# Patient Record
Sex: Male | Born: 1955 | Marital: Single | Smoking: Never smoker
Health system: Southern US, Community
[De-identification: ages and names within clinical notes are randomized; demographics above are authoritative.]

---

## 2015-12-17 ENCOUNTER — Encounter: Payer: Self-pay | Admitting: Podiatry

## 2015-12-17 ENCOUNTER — Ambulatory Visit (INDEPENDENT_AMBULATORY_CARE_PROVIDER_SITE_OTHER): Payer: BLUE CROSS/BLUE SHIELD | Admitting: Podiatry

## 2015-12-17 ENCOUNTER — Ambulatory Visit (HOSPITAL_BASED_OUTPATIENT_CLINIC_OR_DEPARTMENT_OTHER)
Admission: RE | Admit: 2015-12-17 | Discharge: 2015-12-17 | Disposition: A | Discharge status: Home / Self Care | Payer: BLUE CROSS/BLUE SHIELD | Source: Ambulatory Visit | Attending: Podiatry | Admitting: Podiatry

## 2015-12-17 VITALS — BP 122/78 | HR 59 | Resp 18

## 2015-12-17 DIAGNOSIS — M2041 Other hammer toe(s) (acquired), right foot: Secondary | ICD-10-CM | POA: Diagnosis not present

## 2015-12-17 DIAGNOSIS — M898X9 Other specified disorders of bone, unspecified site: Secondary | ICD-10-CM | POA: Diagnosis not present

## 2015-12-17 NOTE — Progress Notes (Signed)
   Subjective:    Patient ID: Niels Cranshaw, male    DOB: [birth year], 60 y.o.   MRN: [id number]  HPI  60 year old male presents the office as concerns her right second digit toe deformity. He states the pubis he had a hammertoe correction about 2.5 years ago. He states that over time he is tried and notices toe has curved radius pain to the toe for which she points to the plantar medial aspect of the IPJ mostly with going without shoes. He is tried shoe changes and offloading without much relief of symptoms. No other complaints. No history of injury or trauma.   Review of Systems  All other systems reviewed and are negative.      Objective:   Physical Exam General: AAO x3, NAD  Dermatological: Skin is warm, dry and supple bilateral. Nails x 10 are well manicured; remaining integument appears unremarkable at this time. There are no open sores, no preulcerative lesions, no rash or signs of infection present.  Vascular: Dorsalis Pedis artery and Posterior Tibial artery pedal pulses are 2/4 bilateral with immedate capillary fill time. Pedal hair growth present. No varicosities and no lower extremity edema present bilateral. There is no pain with calf compression, swelling, warmth, erythema.   Neruologic: Grossly intact via light touch bilateral. Vibratory intact via tuning fork bilateral. Protective threshold with Semmes Wienstein monofilament intact to all pedal sites bilateral. Patellar and Achilles deep tendon reflexes 2+ bilateral. No Babinski or clonus noted bilateral.   Musculoskeletal: Is present overlying the dorsal aspect of the right second toe into the MPJ. There does appear to be a small exostosis at the dorsal aspect of the right second MPJ and there is mild restriction of range of motion. The PIPJ since a rectus position. There does appear to be valgus rotation of the levels the DIPJ and he is walking. There is no other areas of tenderness bilateral lower extremity's. MMT  5/5.   Gait: Unassisted, Nonantalgic.      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 -Discussed both conservative and surgical treatment options. This time is attended multiple conservative treatment without any relief. I discussed surgical intervention which would likely be DIPJ arthrodesis and MPJ joint release with exostectomy however we will await the results of the x-rays before proceeding. -Follow-up in the next couple of weeks.   Ovid Curd, DPM

## 2015-12-31 ENCOUNTER — Ambulatory Visit (INDEPENDENT_AMBULATORY_CARE_PROVIDER_SITE_OTHER): Payer: BLUE CROSS/BLUE SHIELD | Admitting: Podiatry

## 2015-12-31 ENCOUNTER — Encounter: Payer: Self-pay | Admitting: Podiatry

## 2015-12-31 VITALS — BP 119/77 | HR 59 | Resp 18

## 2015-12-31 DIAGNOSIS — M2041 Other hammer toe(s) (acquired), right foot: Secondary | ICD-10-CM | POA: Diagnosis not present

## 2015-12-31 DIAGNOSIS — M19071 Primary osteoarthritis, right ankle and foot: Secondary | ICD-10-CM | POA: Diagnosis not present

## 2015-12-31 NOTE — Patient Instructions (Signed)

## 2015-12-31 NOTE — Progress Notes (Signed)
Patient ID: Tony Carroll, male   DOB: 1956-06-04, 60 y.o.   MRN: [id number]  Subjective: 60 year old male presents the also discussed x-rays and discuss surgical intervention to his right second toe. He states he continues to have pain in the right second toe due to the deformity. He states he is active and he has quite a bit and running and he can become painful. He states that today he has pain with every step. Denies any systemic complaints such as fevers, chills, nausea, vomiting. No acute changes since last appointment, and no other complaints at this time.   Objective: AAO x3, NAD DP/PT pulses palpable bilaterally, CRT less than 3 seconds Protective sensation intact with Simms Weinstein monofilament, vibratory sensation intact, Achilles tendon reflex intact On the right second toe PIP joint sits in a rectus position and third toes. Valgus rotation of the DIPJ and he is walking on the medial aspect of the distal phalanx and the fat pad of the toe was sitting laterally. There is a small dorsal exostosis off the second MPJ is not research with MPJ range of motion on there is no pain with range of motion this time. No other areas of tenderness. No areas of pinpoint bony tenderness or pain with vibratory sensation. MMT 5/5, ROM WNL. No edema, erythema, increase in warmth to bilateral lower extremities.  No open lesions or pre-ulcerative lesions.  No pain with calf compression, swelling, warmth, erythema  Assessment: Right second toe deformity, MPJ arthritis  Plan: -All treatment options discussed with the patient including all alternatives, risks, complications.  -X-rays were reviewed the patient. -I discussed both conservative and surgical treatment options. He states that his symptoms are progressing despite conservative treatment and requests surgical intervention at this time. I discussed with him repair of the right second toe and possible exostectomy and joint release of the second  MPJ. -The incision placement as well as the postoperative course was discussed with the patient. I discussed risks of the surgery which include, but not limited to, infection, bleeding, pain, swelling, need for further surgery, delayed or nonhealing, painful or ugly scar, numbness or sensation changes, over/under correction, recurrence, transfer lesions, further deformity, hardware failure, DVT/PE, loss of toe/foot.Disucssed with him that derotating this toe can lead to vascular compromise and toe loss- he understands this.  Patient understands these risks and wishes to proceed with surgery. The surgical consent was reviewed with the patient all 3 pages were signed. No promises or guarantees were given to the outcome of the procedure. All questions were answered to the best of my ability. Before the surgery the patient was encouraged to call the office if there is any further questions. [facility] basis. -Patient encouraged to call the office with any questions, concerns, change in symptoms.   Ovid CurdMatthew Wagoner, DPM

## 2016-01-05 ENCOUNTER — Telehealth: Payer: Self-pay | Admitting: *Deleted

## 2016-01-05 NOTE — Telephone Encounter (Signed)
"  I'm trying to schedule foot surgery with Dr. Ardelle AntonWagoner."  "I'm calling to set up foot surgery."

## 2016-01-06 NOTE — Telephone Encounter (Signed)
"  I need to schedule my surgery with Dr. Ardelle AntonWagoner."  It appears Misty StanleyLisa gave you a date of 01/14/2016.  "Yes, she told me I needed to call you."  I'll go ahead and get it scheduled for that day.  "I'd like my appointment to be later in the afternoon if possible."  He can't do it late on that day.  He can do it later on 01/21/2016.  "I don't want to wait that long to have it done.  Put me down for the 29th."  Okay, you need to register with the surgical center.  Instructions are in your brochure or you can call them.  They will call you a day or two prior to surgery date with the arrival time.

## 2016-01-12 ENCOUNTER — Telehealth: Payer: Self-pay | Admitting: *Deleted

## 2016-01-12 NOTE — Telephone Encounter (Addendum)
Pt states he has surgery with Dr. Ardelle AntonWagoner 01/14/2016, and would like his medications called to his pharmacy so he can pick them up today.  01/13/2016-Pt states he was not contacted by the Surgery Coordinator concerning getting his medication for pain prior to surgery. I spoke with the pt and informed that he will be given hardcopy prescriptions to take to the pharmacy of his choice, and that after surgery he will have 4-6 hours of pain coverage from the surgery anesthesia, enough time for his friend to get his prescriptions.  Pt states understanding.

## 2016-01-14 DIAGNOSIS — M2041 Other hammer toe(s) (acquired), right foot: Secondary | ICD-10-CM | POA: Diagnosis not present

## 2016-01-14 DIAGNOSIS — M24571 Contracture, right ankle: Secondary | ICD-10-CM | POA: Diagnosis not present

## 2016-01-21 ENCOUNTER — Ambulatory Visit (HOSPITAL_BASED_OUTPATIENT_CLINIC_OR_DEPARTMENT_OTHER)
Admission: RE | Admit: 2016-01-21 | Discharge: 2016-01-21 | Disposition: A | Discharge status: Home / Self Care | Payer: BLUE CROSS/BLUE SHIELD | Source: Ambulatory Visit | Attending: Podiatry | Admitting: Podiatry

## 2016-01-21 ENCOUNTER — Ambulatory Visit (INDEPENDENT_AMBULATORY_CARE_PROVIDER_SITE_OTHER): Payer: BLUE CROSS/BLUE SHIELD | Admitting: Podiatry

## 2016-01-21 DIAGNOSIS — Z9889 Other specified postprocedural states: Secondary | ICD-10-CM

## 2016-01-21 DIAGNOSIS — M2041 Other hammer toe(s) (acquired), right foot: Secondary | ICD-10-CM

## 2016-01-21 DIAGNOSIS — M2040 Other hammer toe(s) (acquired), unspecified foot: Secondary | ICD-10-CM | POA: Insufficient documentation

## 2016-01-21 NOTE — Progress Notes (Signed)
Patient ID: Tony Carroll, male   DOB: [birth year], 60 y.o.   MRN: [id number]  Subjective: Tony Carroll is a 60 y.o. is seen today in office s/p right 2nd toe repair preformed on 01/14/16. He states he is doing well and even after the surgery he RT feels better than he did prior to the surgery. He has stopped taking pain medicine. He is continued surgical shoe. He is finished his antibiotics today. Denies any systemic complaints such as fevers, chills, nausea, vomiting. No calf pain, chest pain, shortness of breath.   Objective: General: No acute distress, AAOx3  DP/PT pulses palpable 2/4, CRT < 3 sec to all digits.  Protective sensation intact. Motor function intact.  Right foot: Incision is well coapted without any evidence of dehiscence and sutures intact. There is no surrounding erythema, ascending cellulitis, fluctuance, crepitus, malodor, drainage/purulence. There is mild edema around the surgical site. There is no significant pain along the surgical site. A wire intact the toe or any drainage or redness around the area. No other areas of tenderness to bilateral lower extremities.  No other open lesions or pre-ulcerative lesions.  No pain with calf compression, swelling, warmth, erythema.   Assessment and Plan:  Status post second toe repair, doing well with no complications   -Treatment options discussed including all alternatives, risks, and complications -X-rays ordered and reviewed -Dressings were removed. Incisions coapted. There is good color to the toe and no issue of vascular compromise. Antibiotic ointment was applied followed by dressing. Keep dressing clean, dry, intact. -Continue surgical shoe, weightbearing as tolerated. -Ice/elevation -Pain medication as needed. -Monitor for any clinical signs or symptoms of infection and DVT/PE and directed to call the office immediately should any occur or go to the ER. -Follow-up in 1 week for suture removal or sooner if any problems arise.  In the meantime, encouraged to call the office with any questions, concerns, change in symptoms.   Ovid CurdMatthew Wagoner, DPM

## 2016-01-28 ENCOUNTER — Encounter: Payer: Self-pay | Admitting: Podiatry

## 2016-02-02 ENCOUNTER — Encounter: Payer: Self-pay | Admitting: Podiatry

## 2016-02-02 ENCOUNTER — Ambulatory Visit (INDEPENDENT_AMBULATORY_CARE_PROVIDER_SITE_OTHER): Payer: BLUE CROSS/BLUE SHIELD | Admitting: Podiatry

## 2016-02-02 DIAGNOSIS — M2041 Other hammer toe(s) (acquired), right foot: Secondary | ICD-10-CM

## 2016-02-02 DIAGNOSIS — Z9889 Other specified postprocedural states: Secondary | ICD-10-CM

## 2016-02-03 NOTE — Progress Notes (Signed)
Subjective:     Patient ID: Tony Carroll, male   DOB: 11-28-1955, 60 y.o.   MRN: [id number]  HPI patient states I'm doing well with my right foot with pin and alignment and wound edges well coapted with minimal discomfort   Review of Systems     Objective:   Physical Exam Neurovascular status intact muscle strength adequate with second toe in good alignment with pin in place second digit with good plantarflexion noted of the MPJ    Assessment:     Doing well post surgical correction right foot    Plan:     Stitches were removed with wound edges coapted well and sterile dressing reapplied with instructions on continued immobilization. Reappoint to recheck again in the next 2-3 weeks for pin removal or earlier if any issues were to occur

## 2016-02-09 ENCOUNTER — Ambulatory Visit (INDEPENDENT_AMBULATORY_CARE_PROVIDER_SITE_OTHER): Payer: BLUE CROSS/BLUE SHIELD | Admitting: Podiatry

## 2016-02-09 ENCOUNTER — Ambulatory Visit (INDEPENDENT_AMBULATORY_CARE_PROVIDER_SITE_OTHER): Payer: BLUE CROSS/BLUE SHIELD

## 2016-02-09 ENCOUNTER — Encounter: Payer: Self-pay | Admitting: Podiatry

## 2016-02-09 VITALS — BP 121/72 | HR 62 | Resp 18

## 2016-02-09 DIAGNOSIS — Z9889 Other specified postprocedural states: Secondary | ICD-10-CM

## 2016-02-09 DIAGNOSIS — B351 Tinea unguium: Secondary | ICD-10-CM | POA: Diagnosis not present

## 2016-02-09 DIAGNOSIS — M2041 Other hammer toe(s) (acquired), right foot: Secondary | ICD-10-CM

## 2016-02-09 NOTE — Progress Notes (Signed)
Patient ID: Tony Carroll, male   DOB: [name], 60 y.o.   MRN: [id number]  Subjective: Tony Carroll is a 60 y.o. is seen today in office s/p right 2nd hammertoe repair on 01/14/16. He states that he is doing well he did not have any pain or discomfort and is doing great. Denies any systemic complaints such as fevers, chills, nausea, vomiting. No calf pain, chest pain, shortness of breath. He states most toenails become discolored.  Objective: General: No acute distress, AAOx3  DP/PT pulses palpable 2/4, CRT < 3 sec to all digits.  Protective sensation intact. Motor function intact.  Right foot: Incision is well coapted without any evidence of dehiscence and scar has formed. There is no surrounding erythema, ascending cellulitis, fluctuance, crepitus, malodor, drainage/purulence. There is milad edema around the surgical site. There is no pain along the surgical site. K-wire intact.  Nails are dystrophic, discolored, hypertrophic. No tenderness and no synovitis or drainage. No other areas of tenderness to bilateral lower extremities.  No other open lesions or pre-ulcerative lesions.  No pain with calf compression, swelling, warmth, erythema.   Assessment and Plan:  Status post right foto surgery, doing well with no complications   -Treatment options discussed including all alternatives, risks, and complications -X-rays were obtained and reviewed with the patient. Hardware intact. No evidence of acute fracture. -Antibiotic 1 applied followed by dressing. Keep dressing clean, dry, intact. -Ice/elevation -Pain medication as needed. -Nails sent for biopsy. -Monitor for any clinical signs or symptoms of infection and DVT/PE and directed to call the office immediately should any occur or go to the ER. -Follow-up in 2 weeks or sooner if any problems arise. In the meantime, encouraged to call the office with any questions, concerns, change in symptoms.  *X-ray/pin removal next appointment  likely  Tony Carroll, DPM

## 2016-02-25 ENCOUNTER — Ambulatory Visit (INDEPENDENT_AMBULATORY_CARE_PROVIDER_SITE_OTHER): Payer: BLUE CROSS/BLUE SHIELD | Admitting: Podiatry

## 2016-02-25 ENCOUNTER — Encounter: Payer: Self-pay | Admitting: Podiatry

## 2016-02-25 ENCOUNTER — Ambulatory Visit (HOSPITAL_BASED_OUTPATIENT_CLINIC_OR_DEPARTMENT_OTHER)
Admission: RE | Admit: 2016-02-25 | Discharge: 2016-02-25 | Disposition: A | Discharge status: Home / Self Care | Payer: BLUE CROSS/BLUE SHIELD | Source: Ambulatory Visit | Attending: Podiatry | Admitting: Podiatry

## 2016-02-25 DIAGNOSIS — B351 Tinea unguium: Secondary | ICD-10-CM | POA: Diagnosis not present

## 2016-02-25 DIAGNOSIS — Z9889 Other specified postprocedural states: Secondary | ICD-10-CM | POA: Diagnosis not present

## 2016-02-26 DIAGNOSIS — B351 Tinea unguium: Secondary | ICD-10-CM | POA: Insufficient documentation

## 2016-02-26 NOTE — Progress Notes (Signed)
Patient ID: Tony Carroll, male   DOB: 05-06-1956, 60 y.o.   MRN: [id number]  Subjective: Tony Carroll is a 60 y.o. is seen today in office s/p right 2nd hammertoe repair on 01/14/16. He states he is doing well she's having no pain to the surgical site. Has had no redness or drainage from the pin site. He states the toe already feels much with it did prior to surgery. He has gone without the surgical shoe and con barefoot at times around the house. He has a presented discussed nail culture results. Denies any systemic complaints such as fevers, chills, nausea, vomiting. No calf pain, chest pain, shortness of breath. He states most toenails become discolored.  Objective: General: No acute distress, AAOx3  DP/PT pulses palpable 2/4, CRT < 3 sec to all digits.  Protective sensation intact. Motor function intact.  Right foot: Incision is well coapted without any evidence of dehiscence and scar has formed. There is no surrounding erythema, ascending cellulitis, fluctuance, crepitus, malodor, drainage/purulence. There is mild edema around the surgical site along the distal toe. There is no pain along the surgical site. K-wire intact without any drainage or redness. Nails are dystrophic, discolored, hypertrophic. No tenderness and no redness, swelling or drainage. No other areas of tenderness to bilateral lower extremities.  No other open lesions or pre-ulcerative lesions.  No pain with calf compression, swelling, warmth, erythema.   Assessment and Plan:  Status post right foot surgery, doing well with no complications; onychomycosis.   -Treatment options discussed including all alternatives, risks, and complications -X-rays were obtained and reviewed with the patient. Hardware intact. No evidence of acute fracture. -The K wire was removed today in total without complications. In the buttock ointment was applied. He can start showering return to regular shoe gear. Follow-up with me in 4 weeks if any  issues. Continue to ice and elevate. -Discussed nail culture results. Does reveal onychomycosis. A discussion in regards to treatment options he elected to proceed with topical treatment. Prescribed compound cream. -Follow-up in 4 weeks or sooner if any problems arise. In the meantime, encouraged to call the office with any questions, concerns, change in symptoms.  Ovid CurdMatthew Abrish Erny, DPM

## 2016-03-01 ENCOUNTER — Telehealth: Payer: Self-pay | Admitting: *Deleted

## 2016-03-01 MED ORDER — NONFORMULARY OR COMPOUNDED ITEM: Status: AC

## 2016-03-01 NOTE — Telephone Encounter (Signed)
Dr. Ardelle AntonWagoner ordered [employer] [facility] Nail/Foot.  Faxed.

## 2016-03-24 ENCOUNTER — Ambulatory Visit (HOSPITAL_BASED_OUTPATIENT_CLINIC_OR_DEPARTMENT_OTHER)
Admission: RE | Admit: 2016-03-24 | Discharge: 2016-03-24 | Disposition: A | Discharge status: Home / Self Care | Payer: BLUE CROSS/BLUE SHIELD | Source: Ambulatory Visit | Attending: Podiatry | Admitting: Podiatry

## 2016-03-24 ENCOUNTER — Encounter: Payer: Self-pay | Admitting: Podiatry

## 2016-03-24 ENCOUNTER — Ambulatory Visit (INDEPENDENT_AMBULATORY_CARE_PROVIDER_SITE_OTHER): Payer: BLUE CROSS/BLUE SHIELD | Admitting: Podiatry

## 2016-03-24 DIAGNOSIS — Z9889 Other specified postprocedural states: Secondary | ICD-10-CM | POA: Diagnosis not present

## 2016-03-24 DIAGNOSIS — B351 Tinea unguium: Secondary | ICD-10-CM

## 2016-03-24 DIAGNOSIS — M2041 Other hammer toe(s) (acquired), right foot: Secondary | ICD-10-CM

## 2016-03-24 NOTE — Progress Notes (Signed)
Patient ID: Tony Carroll, male   DOB: 1956/01/10, 60 y.o.   MRN: [id number]  Subjective: Tony MedicoFrancis Carroll is a 60 y.o. is seen today in office s/p right 2nd hammertoe repair on 01/14/16. He states that overall he is doing much better compared to prior surgery. His walk 3 miles without any problems. He walks on hard surfaces without any pain at this time. Gets occasional swelling to the toe is resolving as well. He has no issues. He is also been using the cream for the toenail fungus which is helped already. No other complaints and no new concerns today.  Objective: General: No acute distress, AAOx3  DP/PT pulses palpable 2/4, CRT < 3 sec to all digits.  Protective sensation intact. Motor function intact.  Right foot: Incision is well coapted without any evidence of dehiscence and scar has formed. The second toe is in a rectus position. There is mild restriction second MPJ range of motion with crepitation however no pain. No other areas of tenderness. The nails. He be hypertrophic, dystrophic, discolored and brittle however subjective has improved. No redness or swelling. No tenderness the toenails. No other areas of tenderness to bilateral lower extremities.  No other open lesions or pre-ulcerative lesions.  No pain with calf compression, swelling, warmth, erythema.   Assessment and Plan:  Status post right foot surgery, doing well with no complications; onychomycosis.   -Treatment options discussed including all alternatives, risks, and complications -X-rays were obtained and reviewed with the patient. Toe rectus. No evidence of acute fracture -Continue to tape the toe  4 swelling of needed.Can start increase activity as tolerated with his knee prompt to call the office. -Continue compound cream for onychomycosis -Follow-up as needed.  Ovid CurdMatthew Wagoner, DPM

## 2016-03-30 ENCOUNTER — Encounter: Payer: Self-pay | Admitting: Podiatry

## 2016-06-11 ENCOUNTER — Encounter: Payer: Self-pay | Admitting: *Deleted

## 2016-06-11 NOTE — Progress Notes (Signed)
   DOS 01-14-16  MPJ release 2nd  Toe right

## 2017-01-07 IMAGING — DX DG FOOT COMPLETE 3+V*R*
3 series · 3 of 3 positions shown · non-contrast
Comparison: 02/09/2016

CLINICAL DATA: Six aches post foot surgery

EXAM:
RIGHT FOOT COMPLETE - 3+ VIEW

[foot ap]
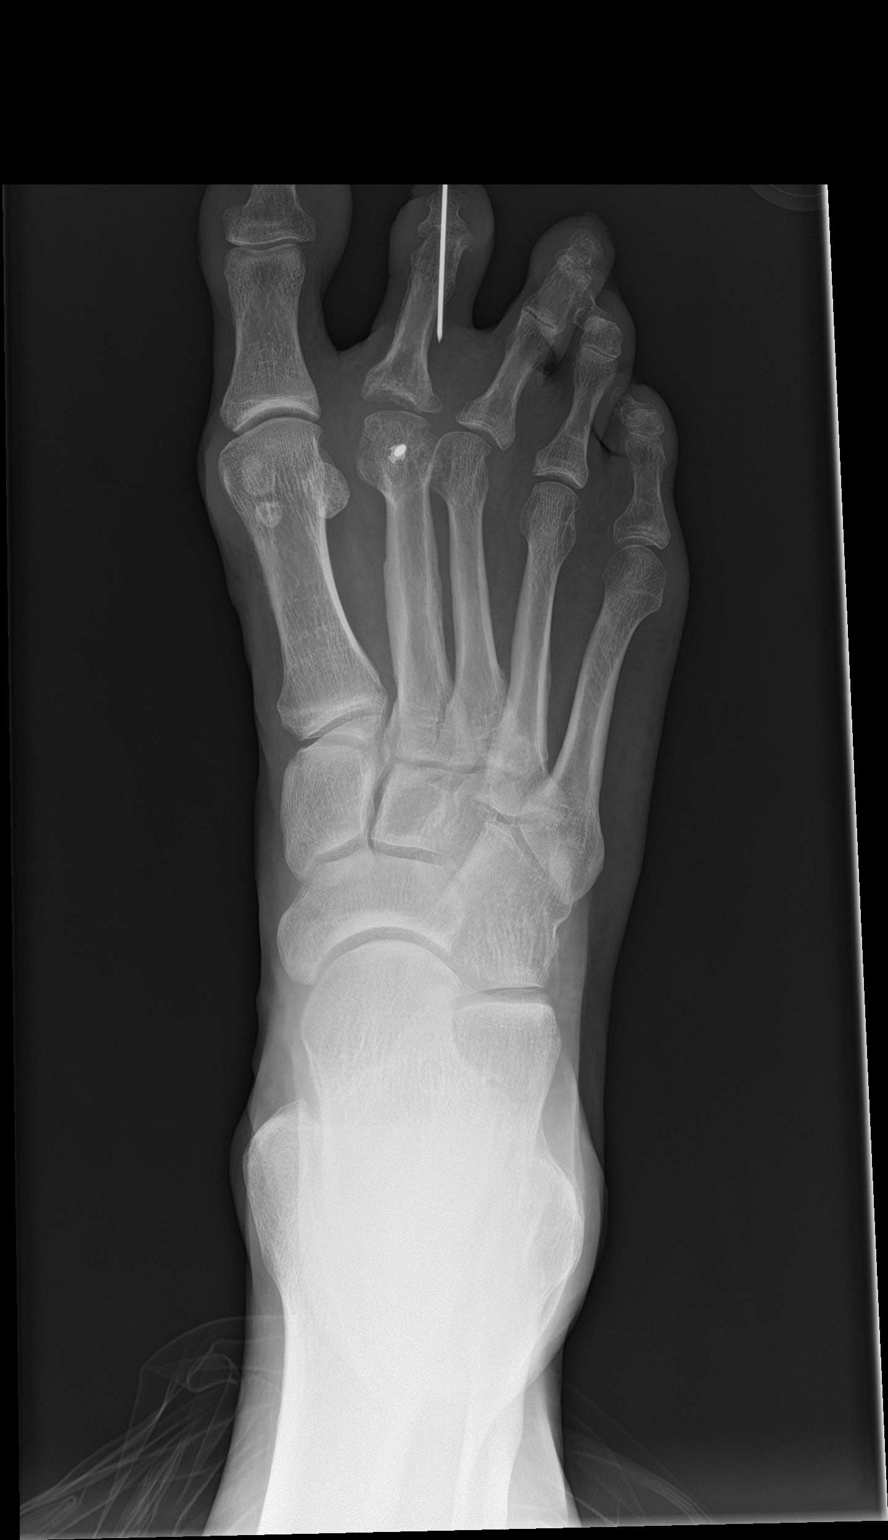

[foot obl]
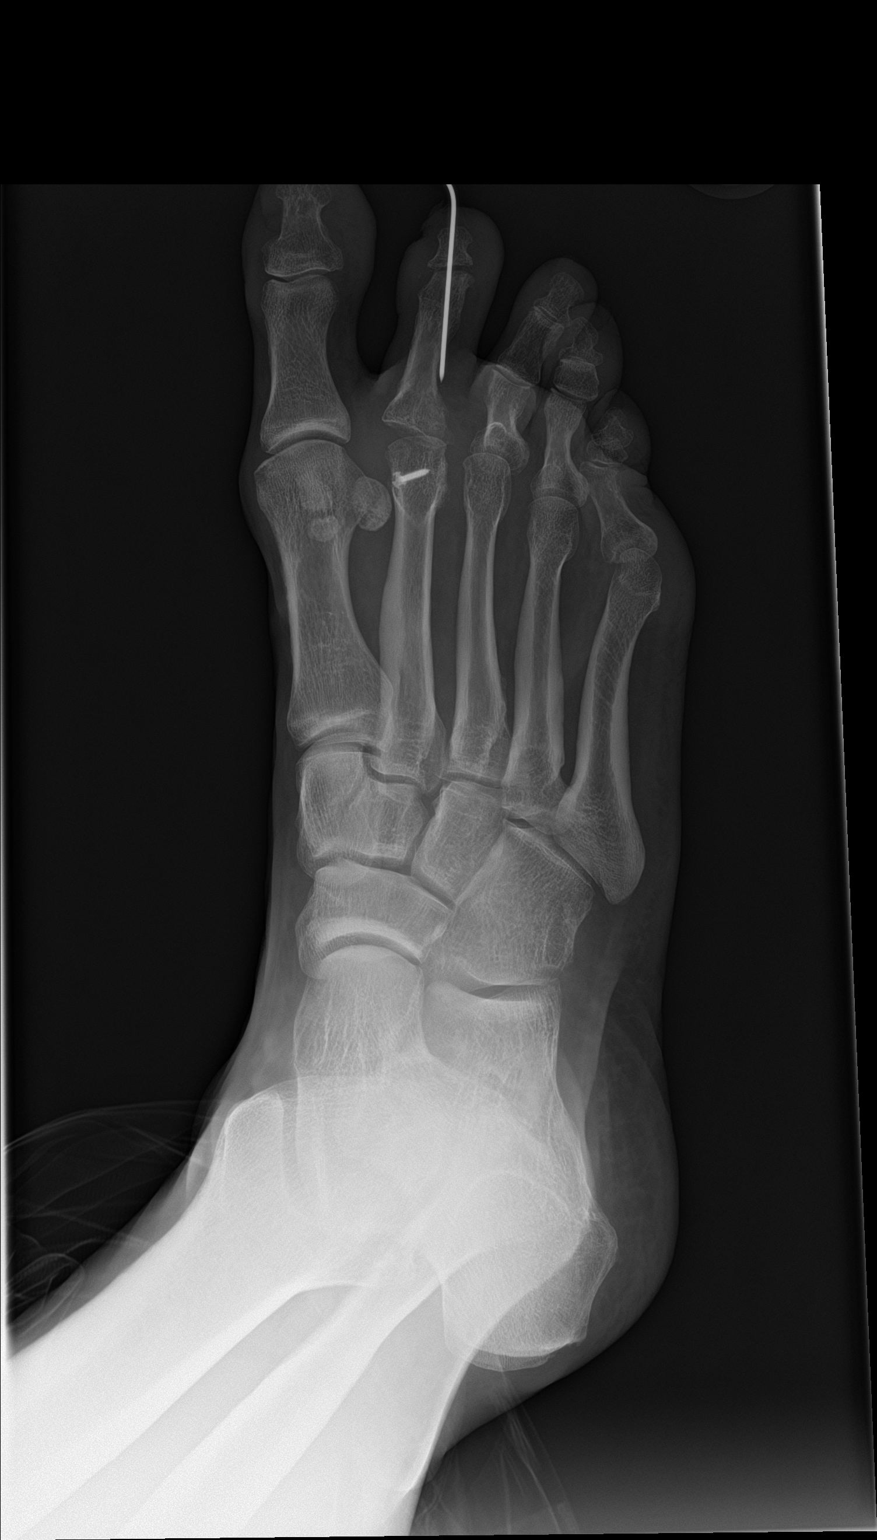

[foot lat]
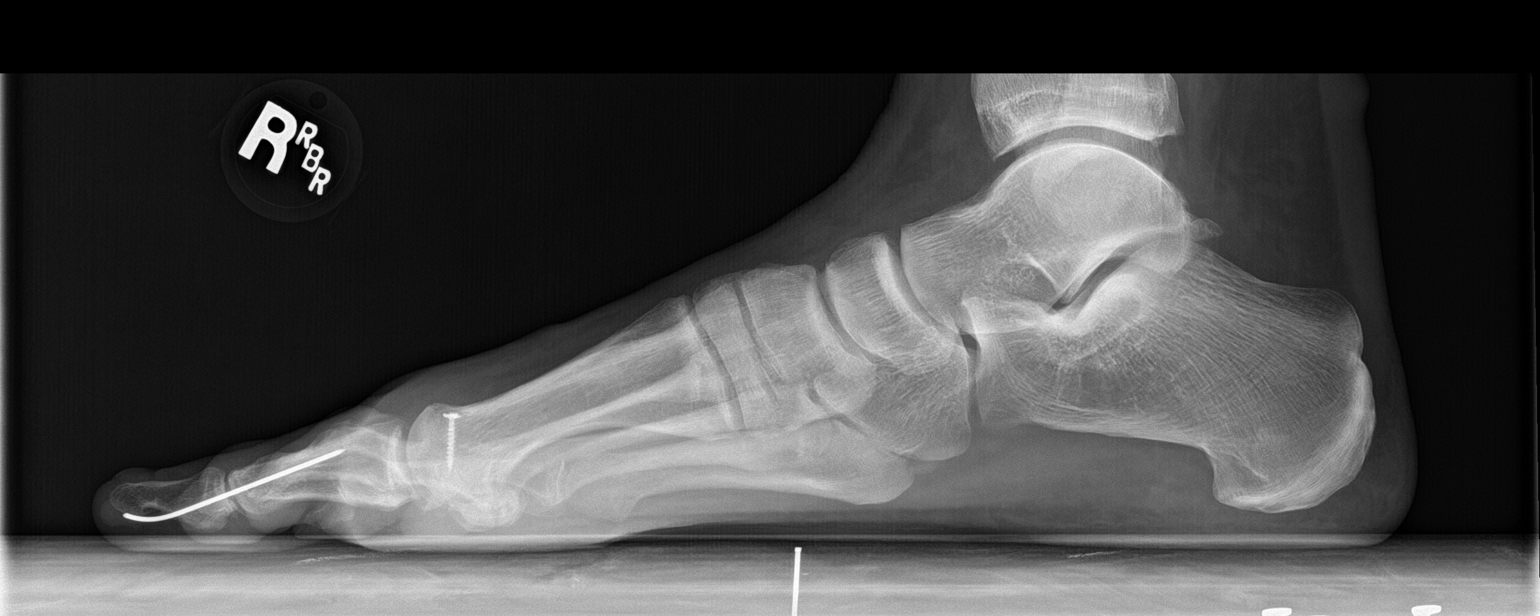

[3 of 3 positions shown; findings below may reference images not displayed]

FINDINGS: There is a pin which transfixes the IP joints of the second toe.
There is periosteum reaction surrounding the second toe. Metal screw
in the head of the second metatarsal. There is somewhat wavy
periosteum reaction along the second metatarsal.
IMPRESSION: Postoperative changes as described.

## 2022-11-08 ENCOUNTER — Ambulatory Visit: Payer: BLUE CROSS/BLUE SHIELD | Admitting: Podiatry
